# Patient Record
Sex: Male | Born: 1990 | Race: White | Hispanic: No | Marital: Single | Smoking: Current every day smoker
Health system: Southern US, Community
[De-identification: ages and names within clinical notes are randomized; demographics above are authoritative.]

---
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
## 2016-05-13 ENCOUNTER — Inpatient Hospital Stay (HOSPITAL_COMMUNITY)
Admission: AD | Admit: 2016-05-13 | Discharge: 2016-05-17 | DRG: 897 | Disposition: A | Discharge status: Home / Self Care | Payer: Federal, State, Local not specified - Other | Attending: Psychiatry | Admitting: Psychiatry

## 2016-05-13 DIAGNOSIS — F1610 Hallucinogen abuse, uncomplicated: Secondary | ICD-10-CM | POA: Clinically undetermined

## 2016-05-13 DIAGNOSIS — F3289 Other specified depressive episodes: Secondary | ICD-10-CM

## 2016-05-13 DIAGNOSIS — F19950 Other psychoactive substance use, unspecified with psychoactive substance-induced psychotic disorder with delusions: Principal | ICD-10-CM | POA: Diagnosis present

## 2016-05-13 DIAGNOSIS — F1190 Opioid use, unspecified, uncomplicated: Secondary | ICD-10-CM | POA: Diagnosis not present

## 2016-05-13 DIAGNOSIS — F1994 Other psychoactive substance use, unspecified with psychoactive substance-induced mood disorder: Secondary | ICD-10-CM | POA: Diagnosis present

## 2016-05-13 DIAGNOSIS — F332 Major depressive disorder, recurrent severe without psychotic features: Secondary | ICD-10-CM | POA: Diagnosis present

## 2016-05-13 DIAGNOSIS — F1990 Other psychoactive substance use, unspecified, uncomplicated: Secondary | ICD-10-CM

## 2016-05-13 DIAGNOSIS — Z590 Homelessness: Secondary | ICD-10-CM | POA: Diagnosis not present

## 2016-05-13 DIAGNOSIS — F1220 Cannabis dependence, uncomplicated: Secondary | ICD-10-CM | POA: Diagnosis not present

## 2016-05-13 DIAGNOSIS — F1590 Other stimulant use, unspecified, uncomplicated: Secondary | ICD-10-CM | POA: Clinically undetermined

## 2016-05-13 DIAGNOSIS — F19929 Other psychoactive substance use, unspecified with intoxication, unspecified: Secondary | ICD-10-CM

## 2016-05-13 DIAGNOSIS — E876 Hypokalemia: Secondary | ICD-10-CM | POA: Diagnosis present

## 2016-05-13 DIAGNOSIS — Z79899 Other long term (current) drug therapy: Secondary | ICD-10-CM | POA: Diagnosis not present

## 2016-05-13 DIAGNOSIS — F1510 Other stimulant abuse, uncomplicated: Secondary | ICD-10-CM | POA: Diagnosis not present

## 2016-05-13 DIAGNOSIS — F1020 Alcohol dependence, uncomplicated: Secondary | ICD-10-CM | POA: Diagnosis not present

## 2016-05-13 MED ORDER — BENZTROPINE MESYLATE 1 MG PO TABS: 1.0000 mg | ORAL_TABLET | Freq: Four times a day (QID) | ORAL | Status: DC | PRN

## 2016-05-13 MED ORDER — TRAZODONE HCL 50 MG PO TABS
50.0000 mg | ORAL_TABLET | Freq: Every evening | ORAL | Status: DC | PRN
  Filled 2016-05-13: qty 1
  Filled 2016-05-13: qty 7

## 2016-05-13 MED ORDER — LORAZEPAM 2 MG/ML IJ SOLN: 2.0000 mg | Freq: Four times a day (QID) | INTRAMUSCULAR | Status: DC | PRN

## 2016-05-13 MED ORDER — CLONIDINE HCL 0.1 MG PO TABS
0.1000 mg | ORAL_TABLET | Freq: Every day | ORAL | Status: DC
Start: 2016-05-18 — End: 2016-05-13

## 2016-05-13 MED ORDER — LORAZEPAM 1 MG PO TABS: 2.0000 mg | ORAL_TABLET | Freq: Four times a day (QID) | ORAL | Status: DC | PRN

## 2016-05-13 MED ORDER — ACETAMINOPHEN 325 MG PO TABS
650.0000 mg | ORAL_TABLET | Freq: Four times a day (QID) | ORAL | Status: DC | PRN
Start: 2016-05-13 — End: 2016-05-17

## 2016-05-13 MED ORDER — ALUM & MAG HYDROXIDE-SIMETH 200-200-20 MG/5ML PO SUSP: 30.0000 mL | ORAL | Status: DC | PRN

## 2016-05-13 MED ORDER — HYDROXYZINE HCL 25 MG PO TABS
25.0000 mg | ORAL_TABLET | Freq: Four times a day (QID) | ORAL | Status: DC | PRN
  Filled 2016-05-13: qty 1
  Filled 2016-05-13: qty 10

## 2016-05-13 MED ORDER — CLONIDINE HCL 0.1 MG PO TABS: 0.1000 mg | ORAL_TABLET | ORAL | Status: DC

## 2016-05-13 MED ORDER — HALOPERIDOL 5 MG PO TABS: 5.0000 mg | ORAL_TABLET | Freq: Four times a day (QID) | ORAL | Status: DC | PRN

## 2016-05-13 MED ORDER — CLONIDINE HCL 0.1 MG PO TABS
0.1000 mg | ORAL_TABLET | ORAL | Status: DC
  Filled 2016-05-13 (×4): qty 1

## 2016-05-13 MED ORDER — ONDANSETRON 4 MG PO TBDP: 4.0000 mg | ORAL_TABLET | Freq: Four times a day (QID) | ORAL | Status: DC | PRN

## 2016-05-13 MED ORDER — DICYCLOMINE HCL 20 MG PO TABS: 20.0000 mg | ORAL_TABLET | Freq: Four times a day (QID) | ORAL | Status: DC | PRN

## 2016-05-13 MED ORDER — CLONIDINE HCL 0.1 MG PO TABS
0.1000 mg | ORAL_TABLET | Freq: Every day | ORAL | Status: DC
  Filled 2016-05-13 (×2): qty 1

## 2016-05-13 MED ORDER — LOPERAMIDE HCL 2 MG PO CAPS: 2.0000 mg | ORAL_CAPSULE | ORAL | Status: DC | PRN

## 2016-05-13 MED ORDER — MAGNESIUM HYDROXIDE 400 MG/5ML PO SUSP: 30.0000 mL | Freq: Every day | ORAL | Status: DC | PRN

## 2016-05-13 MED ORDER — METHOCARBAMOL 500 MG PO TABS: 500.0000 mg | ORAL_TABLET | Freq: Three times a day (TID) | ORAL | Status: DC | PRN

## 2016-05-13 MED ORDER — CLONIDINE HCL 0.1 MG PO TABS: 0.1000 mg | ORAL_TABLET | Freq: Four times a day (QID) | ORAL | Status: DC

## 2016-05-13 MED ORDER — NAPROXEN 500 MG PO TABS: 500.0000 mg | ORAL_TABLET | Freq: Two times a day (BID) | ORAL | Status: DC | PRN

## 2016-05-13 MED ORDER — CLONIDINE HCL 0.1 MG PO TABS
0.1000 mg | ORAL_TABLET | Freq: Four times a day (QID) | ORAL | Status: AC
  Administered 2016-05-15: 0.1 mg via ORAL
  Filled 2016-05-13 (×11): qty 1

## 2016-05-13 NOTE — Progress Notes (Signed)
Pt presented as angry and uncooperative with assessment. Denied SI/HI/AVH.

## 2016-05-13 NOTE — BH Assessment (Signed)
Out of System Assessment Note  Pt was [facility] ED tonight after being found by LE [place] per pt record. Pt could not expalin why he was there or what he was doing. Pt did state "I was trying to stay away from people," "someone was trying to get me," and "something just ain't right." Pt denied SI, HI and AVH. Per LE, pt stated that he was "seeing shadows" earlier. Pt denied any VH but, appeared as if he was not sure. Pt tested positive for Methamphetamines, MDMA and THC tonight when tested in the ED. Pt sts he is not prescribed any medication, does not have a psychiatrist and does not have a therapist. Pt sts he has never been psychiatrically hospitalized.   Pt sts he is homeless and separated from his wife. Pt sts he graduated high school but, is currently unemployed. Per pt record, pt denies an abuse hx. Pt denies symptoms of depression or anxiety. Pt would not give any information about any legal hx or anger/aggression issues. Pt reported regular (daily if possible) use of methamphetamine, tobacco (nicotine) and alcohol. Pt also reported periodic use of cannabis and fentanyl patches. Pt tested positive for MDMA tonight but denied any use, past or present. Pt sts "maybe someone slipped it to me."   Pt appeared disheveled and unclean, wearing soiled pants and no shirt with what appeared to be dirt on his face and body. Pt appeared confused and irritable. Pt spoke in a clear tone and at a normal pace. Pt moved in a normal manner when he moved. Pt did not appear to be responding to any internal stimuli. Pt stated he was not depressed or anxious. Pt appeared in an angry mood with an angry affect. Pt's memory seemed intact but, his judgement and insight seemed impaired. Pt's thought processes appeared coherent and relevant.  Pt seemed to be having paranoid delusion based on his comments.   Diagnosis:  Major Depressive Disorder. Severe Opioid Abuse   Substance-Induced Psychotic D/O  Past Medical History: No past medical history on file.  No past surgical history on file.  Family History: No family history on file.  Social History:  has no tobacco, alcohol, and drug history on file.  Additional Social History:  Alcohol / Drug Use History of alcohol / drug use?: Yes Longest period of sobriety (when/how long): Pateint denies any history of sobriety Negative Consequences of Use: Financial, Legal, Personal relationships, Work / School Withdrawal Symptoms:  (None Reported) Substance #1 Name of Substance 1: Opiates 1 - Age of First Use: Patient reports that he does not remember  1 - Amount (size/oz): Varies 1 - Frequency: Varies 1 - Duration: Varies 1 - Last Use / Amount: 2 days ago.  Patient reports that he cannot remember what he used.   CIWA:   COWS:    PATIENT STRENGTHS: (choose at least two) Average or above average intelligence Communication skills Physical Health  Allergies: Allergies not on file  Home Medications:  (Not in a hospital admission)  OB/GYN Status:  No LMP for male patient.  General Assessment Data Location of Assessment:  [facility] TTS Assessment: Out of system Is this a Tele or Face-to-Face Assessment?: Tele Assessment Is this an Initial Assessment or a Re-assessment for this encounter?: Initial Assessment Marital status: Married Hector Rivers name: NA Is patient pregnant?: No Pregnancy Status: No Living Arrangements: Other (Comment) (Homeless ) Can pt return to current living arrangement?: Yes Admission Status:  Involuntary Is patient capable of signing voluntary admission?: No Referral Source: Self/Family/Friend Insurance type: Self Pay   Medical Screening Exam [facility] Walk-in ONLY) Medical Exam completed:  (NA)  Crisis Care Plan Living Arrangements: Other (Comment) (Homeless ) Legal Guardian:  (NA) Name of Psychiatrist: None Reported Name of Therapist: None Reported  Education Status Is  patient currently in school?: No Current Grade: NA Highest grade of school patient has completed: NA Name of school: NA Contact person: NA  Risk to self with the past 6 months Suicidal Ideation: No Has patient been a risk to self within the past 6 months prior to admission? : No Suicidal Intent: No Has patient had any suicidal intent within the past 6 months prior to admission? : No Is patient at risk for suicide?: No Suicidal Plan?: No Has patient had any suicidal plan within the past 6 months prior to admission? : No Access to Means: No What has been your use of drugs/alcohol within the last 12 months?: Methamphetamines, MDMA and THC Previous Attempts/Gestures: No How many times?: 0 Other Self Harm Risks: None Reported Triggers for Past Attempts:  (NA) Intentional Self Injurious Behavior: None Family Suicide History: No Recent stressful life event(s): Conflict (Comment), Job Loss, Financial Problems (Seperated from his wife) Persecutory voices/beliefs?: Yes Depression: Yes Depression Symptoms: Despondent, Insomnia, Isolating, Fatigue, Guilt, Loss of interest in usual pleasures, Feeling worthless/self pity, Feeling angry/irritable Substance abuse history and/or treatment for substance abuse?: Yes Suicide prevention information given to non-admitted patients: Not applicable  Risk to Others within the past 6 months Homicidal Ideation: No Does patient have any lifetime risk of violence toward others beyond the six months prior to admission? : No Thoughts of Harm to Others: Yes-Currently Present Comment - Thoughts of Harm to Others: Reports that he has to protect himself Current Homicidal Intent: No Current Homicidal Plan: No Access to Homicidal Means: No Identified Victim: None Reported History of harm to others?: No Assessment of Violence: None Noted Violent Behavior Description: n Does patient have access to weapons?: Yes (Comment) Criminal Charges Pending?: No Does patient  have a court date: No Is patient on probation?: No  Psychosis Hallucinations: None noted Delusions: Grandiose (Believes that someone is after him)  Mental Status Report Appearance/Hygiene: Disheveled Eye Contact: Poor Motor Activity: Freedom of movement Speech: Aggressive, Pressured Level of Consciousness: Alert Mood: Depressed, Angry Affect: Anxious Anxiety Level: Minimal Thought Processes: Flight of Ideas Judgement: Impaired Orientation: Person, Place, Time, Situation Obsessive Compulsive Thoughts/Behaviors: None  Cognitive Functioning Concentration: Decreased Memory: Recent Intact, Remote Intact IQ: Average Insight: Fair Impulse Control: Poor Appetite: Fair Weight Loss: 0 Weight Gain: 0 Sleep: Decreased Total Hours of Sleep: 4 Vegetative Symptoms: Decreased grooming, Staying in bed  ADLScreening [facility] Assessment Services) Patient's cognitive ability adequate to safely complete daily activities?: Yes Patient able to express need for assistance with ADLs?: Yes Independently performs ADLs?: Yes (appropriate for developmental age)  Prior Inpatient Therapy Prior Inpatient Therapy: No Prior Therapy Dates: NA Prior Therapy Facilty/Provider(s): NA Reason for Treatment: NA  Prior Outpatient Therapy Prior Outpatient Therapy: No Prior Therapy Dates: NA Prior Therapy Facilty/Provider(s): NA Reason for Treatment: NA Does patient have an ACCT team?: No Does patient have Intensive In-House Services?  : No Does patient have [facility] services? : No Does patient have P4CC services?: No  ADL Screening (condition at time of admission) Patient's cognitive ability adequate to safely complete daily activities?: Yes Is the patient deaf or have difficulty hearing?: No Does the patient have difficulty seeing, even  when wearing glasses/contacts?: No Does the patient have difficulty concentrating, remembering, or making decisions?: No Patient able to express need for assistance with  ADLs?: Yes Does the patient have difficulty dressing or bathing?: No Independently performs ADLs?: Yes (appropriate for developmental age) Does the patient have difficulty walking or climbing stairs?: No Weakness of Legs: None Weakness of Arms/Hands: None  Home Assistive Devices/Equipment Home Assistive Devices/Equipment: None    Abuse/Neglect Assessment (Assessment to be complete while patient is alone) Physical Abuse: Denies Verbal Abuse: Denies Sexual Abuse: Denies Exploitation of patient/patient's resources: Denies Self-Neglect: Denies Values / Beliefs Cultural Requests During Hospitalization: None Spiritual Requests During Hospitalization: None Consults Spiritual Care Consult Needed: No Social Work Consult Needed: No Merchant navy officer (For Healthcare) Does Patient Have a Medical Advance Directive?: No Would patient like information on creating a medical advance directive?: No - Patient declined    Additional Information 1:1 In Past 12 Months?: No CIRT Risk: No Elopement Risk: No Does patient have medical clearance?: Yes     Disposition: Per Dr. Lucianne Muss patient meets criteria for inpatient hospitalization.  Disposition Initial Assessment Completed for this Encounter: Yes Disposition of Patient: Inpatient treatment program (Per Dr. Lucianne Muss - patient meets criteria for inpatient hospita)  Hector Rivers 05/13/2016 12:22 PM

## 2016-05-13 NOTE — Progress Notes (Signed)
D: Pt denies SI/HI/AVH. Pt isolated to room, pt forwards little information.    A: Pt was offered support and encouragement. . Pt was encourage to attend groups. Q 15 minute checks were done for safety.   R: safety maintained on unit.

## 2016-05-13 NOTE — Progress Notes (Signed)
Did not attend group 

## 2016-05-13 NOTE — Progress Notes (Signed)
Patient refused to allow writer to complete admission.  Patient was extremely distracted, paranoid and unable to focus during the admission. Patient became agitated with writer stating that writer was laughing during the admission.  Patient was noted to be responding to internal stimuli.   Writer communicated with oncoming nurse that admission was not complete and another attempt should be made to complete the admission.  Patient refused to sign all paperwork accompanying admission.

## 2016-05-14 DIAGNOSIS — Z79899 Other long term (current) drug therapy: Secondary | ICD-10-CM

## 2016-05-14 DIAGNOSIS — F332 Major depressive disorder, recurrent severe without psychotic features: Secondary | ICD-10-CM

## 2016-05-14 DIAGNOSIS — F1190 Opioid use, unspecified, uncomplicated: Secondary | ICD-10-CM

## 2016-05-14 DIAGNOSIS — F1990 Other psychoactive substance use, unspecified, uncomplicated: Secondary | ICD-10-CM

## 2016-05-14 NOTE — [facility] Counselor (Signed)
Clinical Social Work Note  Unable to complete Psychosocial Assessment due to patient's disorganization.  Will try again on 05/15/16.  Ambrose MantleMareida Grossman-Orr, LCSW 05/14/2016, 3:50 PM

## 2016-05-14 NOTE — Progress Notes (Signed)
D:  Patient denied SI and HI while talking to nurse, contracts for safety.  Denied A/V hallucinations. A:  Patient has refused to take any medications today, clonidine at 0800, 1200, 1700.  Emotional support and encouragement given patient. R:  Safety maintained with 15 minute checks.

## 2016-05-14 NOTE — H&P (Signed)
Psychiatric Admission Assessment Adult  Patient Identification: Hector Rivers MRN:  [id number] Date of Evaluation:  05/14/2016 Chief Complaint:  MDD SEVERE RECURRENT OPIATE USE DISORDER Principal Diagnosis: MDD (major depressive disorder), recurrent severe, without psychosis (HCC) Diagnosis:   Patient Active Problem List   Diagnosis Date Noted  . Substance use disorder [F19.90]   . MDD (major depressive disorder), recurrent severe, without psychosis (HCC) [F33.2] 05/13/2016  . Opiate use [F11.90] 05/13/2016   History of Present Illness: Per Out of system note-Pt was [facility] ED tonight after being found by LE wandering [place] with a knife per pt record. Pt could not expalin why he was there or what he was doing. Pt did state "I was trying to stay away from people," "someone was trying to get me," and "something just ain't right." Pt denied SI, HI and AVH. Per LE, pt stated that he was "seeing shadows" earlier. Pt denied any VH but, appeared as if he was not sure. Pt tested positive for Methamphetamines, MDMA and THC tonight when tested in the ED. Pt sts he is not prescribed any medication, does not have a psychiatrist and does not have a therapist. Pt sts he has never been psychiatrically hospitalized.  Pt sts he is homeless and separated from his wife. Pt sts he graduated high school but, is currently unemployed. Per pt record, pt denies an abuse hx. Pt denies symptoms of depression or anxiety. Pt would not give any information about any legal hx or anger/aggression issues. Pt reported regular (daily if possible) use of methamphetamine, tobacco (nicotine) and alcohol. Pt also reported periodic use of cannabis and fentanyl patches. Pt tested positive for MDMA tonight but denied any use, past or present. Pt sts "maybe someone slipped it to me."  Pt appeared disheveled and unclean, wearing soiled pants and no shirt with what appeared to be dirt on his face and body. Pt  appeared confused and irritable. Pt spoke in a clear tone and at a normal pace. Pt moved in a normal manner when he moved. Pt did not appear to be responding to any internal stimuli. Pt stated he was not depressed or anxious. Pt appeared in an angry mood with an angry affect. Pt's memory seemed intact but, his judgement and insight seemed impaired. Pt's thought processes appeared coherent and relevant.  Pt seemed to be having paranoid delusion based on his comments.  On evaluation: Hector Rivers is awake, alert and oriented *3 , seen resting in bed.  Denies suicidal or homicidal ideation during this assessment . Reports auditory or visual hallucination on admission.patient does not appear to be responding to internal stimuli.Patient validates information that was provided in the HPI. Support, encouragement and reassurance was provided.   Associated Signs/Symptoms: Depression Symptoms:  depressed mood, difficulty concentrating, hopelessness, (Hypo) Manic Symptoms:  Hallucinations, Impulsivity, Anxiety Symptoms:  Excessive Worry, Psychotic Symptoms:  Hallucinations: Auditory PTSD Symptoms: Avoidance:  Decreased Interest/Participation Total Time spent with patient: 30 minutes  Past Psychiatric History:   Is the patient at risk to self? No.  Has the patient been a risk to self in the past 6 months? No.  Has the patient been a risk to self within the distant past? Yes.    Is the patient a risk to others? No.  Has the patient been a risk to others in the past 6 months? No.  Has the patient been a risk to others within the distant past? No.   Prior Inpatient  Therapy: Prior Inpatient Therapy: No Prior Therapy Dates: NA Prior Therapy Facilty/Provider(s): NA Reason for Treatment: NA Prior Outpatient Therapy: Prior Outpatient Therapy: No Prior Therapy Dates: NA Prior Therapy Facilty/Provider(s): NA Reason for Treatment: NA Does patient have an ACCT team?: No Does patient have Intensive  In-House Services?  : No Does patient have [facility] services? : No Does patient have P4CC services?: No  Alcohol Screening:   Substance Abuse History in the last 12 months:  Yes.   Consequences of Substance Abuse: Withdrawal Symptoms:   Headaches Nausea Previous Psychotropic Medications: YES Psychological Evaluations: YES Past Medical History: No past medical history on file. No past surgical history on file. Family History: No family history on file. Family Psychiatric  History:  Tobacco Screening:   Social History:  History  Alcohol use Not on file     History  Drug use: Unknown    Additional Social History: Marital status: Married    History of alcohol / drug use?: Yes Longest period of sobriety (when/how long): Pateint denies any history of sobriety Negative Consequences of Use: Surveyor, quantity, Armed forces operational officer, Personal relationships, Work / Programmer, multimedia Withdrawal Symptoms:  (None Reported) Name of Substance 1: Opiates 1 - Age of First Use: Patient reports that he does not remember  1 - Amount (size/oz): Varies 1 - Frequency: Varies 1 - Duration: Varies 1 - Last Use / Amount: 2 days ago.  Patient reports that he cannot remember what he used.                   Allergies:  No Known Allergies Lab Results: No results found for this or any previous visit (from the past 48 hour(s)).  Blood Alcohol level:  No results found for: [facility]  Metabolic Disorder Labs:  No results found for: HGBA1C, MPG No results found for: PROLACTIN No results found for: CHOL, TRIG, HDL, CHOLHDL, VLDL, LDLCALC  Current Medications: Current Facility-Administered Medications  Medication Dose Route Frequency Provider Last Rate Last Dose  . acetaminophen (TYLENOL) tablet 650 mg  650 mg Oral Q6H PRN Laveda Abbe, NP      . alum & mag hydroxide-simeth (MAALOX/MYLANTA) 200-200-20 MG/5ML suspension 30 mL  30 mL Oral Q4H PRN Laveda Abbe, NP      . haloperidol (HALDOL) tablet 5 mg  5 mg Oral Q6H PRN  Laveda Abbe, NP       And  . benztropine (COGENTIN) tablet 1 mg  1 mg Oral Q6H PRN Laveda Abbe, NP      . cloNIDine (CATAPRES) tablet 0.1 mg  0.1 mg Oral QID Laveda Abbe, NP       Followed by  . [START ON 05/16/2016] cloNIDine (CATAPRES) tablet 0.1 mg  0.1 mg Oral BH-qamhs Laveda Abbe, NP       Followed by  . [START ON 05/18/2016] cloNIDine (CATAPRES) tablet 0.1 mg  0.1 mg Oral QAC breakfast Laveda Abbe, NP      . dicyclomine (BENTYL) tablet 20 mg  20 mg Oral Q6H PRN Laveda Abbe, NP      . hydrOXYzine (ATARAX/VISTARIL) tablet 25 mg  25 mg Oral Q6H PRN Laveda Abbe, NP      . loperamide (IMODIUM) capsule 2-4 mg  2-4 mg Oral PRN Laveda Abbe, NP      . LORazepam (ATIVAN) tablet 2 mg  2 mg Oral Q6H PRN Laveda Abbe, NP       Or  . LORazepam (ATIVAN) injection 2 mg  2 mg Intramuscular Q6H PRN Laveda Abbe, NP      . magnesium hydroxide (MILK OF MAGNESIA) suspension 30 mL  30 mL Oral Daily PRN Laveda Abbe, NP      . methocarbamol (ROBAXIN) tablet 500 mg  500 mg Oral Q8H PRN Laveda Abbe, NP      . naproxen (NAPROSYN) tablet 500 mg  500 mg Oral BID PRN Laveda Abbe, NP      . ondansetron (ZOFRAN-ODT) disintegrating tablet 4 mg  4 mg Oral Q6H PRN Laveda Abbe, NP      . traZODone (DESYREL) tablet 50 mg  50 mg Oral QHS PRN Laveda Abbe, NP       PTA Medications: No prescriptions prior to admission.    Musculoskeletal: Strength & Muscle Tone: within normal limits Gait & Station: normal Patient leans: N/A  Psychiatric Specialty Exam: Physical Exam  ROS  Blood pressure 110/76, pulse (!) 110, temperature 97.7 F (36.5 C), temperature source Oral, resp. rate 18.There is no height or weight on file to calculate BMI.  General Appearance: Casual and Disheveled  Eye Contact:  Good  Speech:  Clear and Coherent  Volume:  Normal  Mood:  Anxious and Depressed  Affect:   Congruent  Thought Process:  Linear  Orientation:  Full (Time, Place, and Person)  Thought Content:  Hallucinations: Auditory Visual  Suicidal Thoughts:  No  Homicidal Thoughts:  No  Memory:  Immediate;   Poor Recent;   Fair Remote;   Fair  Judgement:  Fair  Insight:  Lacking  Psychomotor Activity:  Restlessness  Concentration:  Concentration: Fair  Recall:  [employer] of Knowledge:  Fair  Language:  Good  Akathisia:  No  Handed:  Right  AIMS (if indicated):     Assets:  Communication Skills Physical Health Resilience  ADL's:  Intact  Cognition:  WNL  Sleep:  Number of Hours: 6.75     I agree with current treatment plan on 05/14/2016, Patient seen face-to-face for psychiatric evaluation follow-up, chart reviewed and case discussed with the MD Akhatar. Reviewed the information documented and agree with the treatment plan.  Treatment Plan Summary: Daily contact with patient to assess and evaluate symptoms and progress in treatment and Medication management    Observation Level/Precautions:  15 minute checks  Laboratory:  CBC Chemistry Profile UA  Psychotherapy:  Individual and group session  Medications:  See above  Consultations:  psychiatry  Discharge Concerns:  Safety, stabilization, and risk of access to medication and medication stabilization   Estimated LOS: 5-7days  Other:     Physician Treatment Plan for Primary Diagnosis: MDD (major depressive disorder), recurrent severe, without psychosis (HCC) Long Term Goal(s): Improvement in symptoms so as ready for discharge  Short Term Goals: Ability to identify changes in lifestyle to reduce recurrence of condition will improve, Ability to verbalize feelings will improve and Ability to identify triggers associated with substance abuse/mental health issues will improve  Physician Treatment Plan for Secondary Diagnosis: Principal Problem:   MDD (major depressive disorder), recurrent severe, without psychosis  (HCC) Active Problems:   Opiate use   Substance use disorder  Long Term Goal(s): Improvement in symptoms so as ready for discharge  Short Term Goals: Ability to verbalize feelings will improve, Ability to disclose and discuss suicidal ideas and Ability to maintain clinical measurements within normal limits will improve  I certify that inpatient services furnished can reasonably be expected to improve the patient's condition.  Oneta Rackanika N Lewis, NP 12/9/20173:56 PM  I have examined the patient and agreed with the findings of H&P and treatment plan. I have also done suicide assessment on this patient.

## 2016-05-14 NOTE — [facility] Group Notes (Signed)
[facility] Group Notes: (Clinical Social Work)   05/14/2016      Type of Therapy:  Group Therapy   Participation Level:  Did Not Attend despite MHT prompting   Ambrose MantleMareida Grossman-Orr, LCSW 05/14/2016, 1:07 PM

## 2016-05-14 NOTE — [facility] Suicide Risk Assessment (Signed)
[facility]   Nursing information obtained from:    Demographic factors:    Current Mental Status:    Loss Factors:    Historical Factors:    Risk Reduction Factors:     Total Time spent with patient: 1 hour Principal Problem: MDD (major depressive disorder), recurrent severe, without psychosis (HCC) Diagnosis:   Patient Active Problem List   Diagnosis Date Noted  . MDD (major depressive disorder), recurrent severe, without psychosis (HCC) [F33.2] 05/13/2016  . Opiate use [F11.90] 05/13/2016   Subjective Data: alert, irritable. Depressed and paranoid  Continued Clinical Symptoms:    The "Alcohol Use Disorders Identification Test", Guidelines for Use in Primary Care, Second Edition.  World Science writerHealth Organization [facility](WHO). Score between 0-7:  no or low risk or alcohol related problems. Score between 8-15:  moderate risk of alcohol related problems. Score between 16-19:  high risk of alcohol related problems. Score 20 or above:  warrants further diagnostic evaluation for alcohol dependence and treatment.   CLINICAL FACTORS:   Alcohol/Substance Abuse/Dependencies More than one psychiatric diagnosis Currently Psychotic Unstable or Poor Therapeutic Relationship   Musculoskeletal: Strength & Muscle Tone: within normal limits Gait & Station: normal Patient leans: no lean  Psychiatric Specialty Exam: Physical Exam  Review of Systems  Cardiovascular: Negative for chest pain.  Neurological: Negative for tremors.  Psychiatric/Behavioral: Positive for depression, hallucinations and substance abuse. The patient is nervous/anxious.     There were no vitals taken for this visit.There is no height or weight on file to calculate BMI.  General Appearance: Disheveled  Eye Contact:  Minimal  Speech:  Slow  Volume:  Decreased  Mood:  Depressed and Dysphoric  Affect:  Constricted and Depressed  Thought Process:  Disorganized  Orientation:  Full (Time, Place, and  Person)  Thought Content:  Delusions and Rumination  Suicidal Thoughts:  No  Homicidal Thoughts:  No  Memory:  Immediate;   Fair Recent;   Poor  Judgement:  Poor  Insight:  Present  Psychomotor Activity:  Normal  Concentration:  Concentration: Fair and Attention Span: Fair  Recall:  Poor  Fund of Knowledge:  Poor  Language:  Fair  Akathisia:  Negative  Handed:  Right  AIMS (if indicated):     Assets:  Desire for Improvement  ADL's:  Intact  Cognition:  WNL  Sleep:  Number of Hours: 6.75      COGNITIVE FEATURES THAT CONTRIBUTE TO RISK:  Closed-mindedness    SUICIDE RISK:   Moderate:  Frequent suicidal ideation with limited intensity, and duration, some specificity in terms of plans, no associated intent, good self-control, limited dysphoria/symptomatology, some risk factors present, and identifiable protective factors, including available and accessible social support.   PLAN OF CARE: Admit for stabilization. Medication management and safety. Drug use and insight.   I certify that inpatient services furnished can reasonably be expected to improve the patient's condition.  Thresa RossAKHTAR, Markiah Janeway, MD 05/14/2016, 12:35 PM

## 2016-05-14 NOTE — Progress Notes (Signed)
Nursing Progress Note 7p-7a  D) Patient presents with flat affect. Patient blank and presents with thought blocking at times. Patient refused prescribed Clonidine for 2200. Patient states "I don't need anything". Patient denies SI/HI/AVH or pain. Patient contracts for safety at this time.   A) Patient educated about importance of taking medications and complying with the goals of treatment. When encouraged, patient states "I don't need anything". Emotional support given. Patient on q15 min safety checks. Opportunities for questions or concerns presented to patient. Patient encouraged to continue to identify why he has come to the hospital.  R) Patient remains safe on the unit at this time. Patient is resting in bed without complaints. Will continue to monitor.

## 2016-05-14 NOTE — Progress Notes (Signed)
Patient refused clonidine this morning.  Stated he does not need this medication.  Patient denied SI and HI, contracts for safety.  Denied A/V hallucinations.  Respirations even and unlabored.  No signs/symptoms of pain/distress noted on patient's face/body movements.  Safety maintained with 15 minute checks.

## 2016-05-14 NOTE — Progress Notes (Signed)
[facility] Group Notes:  (Nursing/MHT/Case Management/Adjunct)  Date:  05/14/2016  Time:  9:21 PM  Type of Therapy:  Psychoeducational Skills  Participation Level:  Minimal  Participation Quality:  Inattentive  Affect:  Flat  Cognitive:  Lacking  Insight:  Limited  Engagement in Group:  Resistant  Modes of Intervention:  Education  Summary of Progress/Problems: The patient verbalized in group that his day was "all right". He states that he went to groups but chose not to participate. In terms of the theme of the day, his support system will consist of his son.   Hazle CocaGOODMAN, Lycan Davee S 05/14/2016, 9:21 PM

## 2016-05-14 NOTE — Plan of Care (Signed)
Problem: Education: Goal: Ability to state activities that reduce stress will improve Outcome: Progressing Nurse discussed depression/anxiety/coping skills with patient.    

## 2016-05-14 NOTE — Progress Notes (Signed)
Patient remains paranoid and uncooperative. Patient gives short answers with Clinical research associatewriter and at times presents with though-blocking. Unable to complete assessments for admission. Will report to next shift to reassess in AM.

## 2016-05-14 NOTE — [facility] Group Notes (Signed)
The focus of this group is to educate the patient on the purpose and policies of crisis stabilization and provide a format to answer questions about their admission.  The group details unit policies and expectations of patients while admitted.  Patient did not attend 0900 nurse education orientation group this morning.  Patient stayed in bed.   

## 2016-05-14 NOTE — Plan of Care (Signed)
Problem: Health Behavior/Discharge Planning: Goal: Compliance with treatment plan for underlying cause of condition will improve Outcome: Not Progressing Patient not taking medications as prescribed. Patient has no insight to why he is in the hospital. Patient is minimal during interactions with staff.  Problem: Safety: Goal: Periods of time without injury will increase Outcome: Progressing Patient remains safe on the unit at this time. Patient on q15 min safety checks. VSS.

## 2016-05-15 NOTE — Progress Notes (Signed)
[facility]  05/15/2016 9:52 AM Runell GessDillon C Linthicum  MRN:  [id number] Subjective:  Patient reports " I am doing okay (smiling) Patient reports I got a hold to some bad meth."  Objective:Haroun C Katrinka BlazingSmith is awake, alert and oriented *3. Seen resting in bedroom. Denies suicidal or homicidal ideation. Denies auditory or visual hallucination and does not appear to be responding to internal stimuli. Per staffing notes pt is isolative and guarded.  Patient reports she is medication compliant without mediation side effects.  Patient denies depression or depressive symptoms. Reports good appetite and reports  resting well. Support, encouragement and reassurance was provided.    Principal Problem: MDD (major depressive disorder), recurrent severe, without psychosis (HCC) Diagnosis:   Patient Active Problem List   Diagnosis Date Noted  . Substance use disorder [F19.90]   . MDD (major depressive disorder), recurrent severe, without psychosis (HCC) [F33.2] 05/13/2016  . Opiate use [F11.90] 05/13/2016   Total Time spent with patient: 30 minutes  Past Psychiatric History:   Past Medical History: No past medical history on file. No past surgical history on file. Family History: No family history on file. Family Psychiatric  History:  Social History:  History  Alcohol use Not on file     History  Drug use: Unknown    Social History   Social History  . Marital status: Single    Spouse name: N/A  . Number of children: N/A  . Years of education: N/A   Social History Main Topics  . Smoking status: Not on file  . Smokeless tobacco: Not on file  . Alcohol use Not on file  . Drug use: Unknown  . Sexual activity: Not on file   Other Topics Concern  . Not on file   Social History Narrative  . No narrative on file   Additional Social History:    History of alcohol / drug use?: Yes Longest period of sobriety (when/how long): Pateint denies any history of sobriety Negative  Consequences of Use: Financial, Armed forces operational officerLegal, Personal relationships, Work / School Withdrawal Symptoms:  (None Reported) Name of Substance 1: Opiates 1 - Age of First Use: Patient reports that he does not remember  1 - Amount (size/oz): Varies 1 - Frequency: Varies 1 - Duration: Varies 1 - Last Use / Amount: 2 days ago.  Patient reports that he cannot remember what he used.                   Sleep: Fair  Appetite:  Fair  Current Medications: Current Facility-Administered Medications  Medication Dose Route Frequency Provider Last Rate Last Dose  . acetaminophen (TYLENOL) tablet 650 mg  650 mg Oral Q6H PRN Laveda AbbeLaurie Britton Parks, NP      . alum & mag hydroxide-simeth (MAALOX/MYLANTA) 200-200-20 MG/5ML suspension 30 mL  30 mL Oral Q4H PRN Laveda AbbeLaurie Britton Parks, NP      . haloperidol (HALDOL) tablet 5 mg  5 mg Oral Q6H PRN Laveda AbbeLaurie Britton Parks, NP       And  . benztropine (COGENTIN) tablet 1 mg  1 mg Oral Q6H PRN Laveda AbbeLaurie Britton Parks, NP      . cloNIDine (CATAPRES) tablet 0.1 mg  0.1 mg Oral QID Laveda AbbeLaurie Britton Parks, NP       Followed by  . [START ON 05/16/2016] cloNIDine (CATAPRES) tablet 0.1 mg  0.1 mg Oral BH-qamhs Laveda AbbeLaurie Britton Parks, NP       Followed by  . [START ON 05/18/2016] cloNIDine (CATAPRES) tablet  0.1 mg  0.1 mg Oral QAC breakfast Laveda Abbe, NP      . dicyclomine (BENTYL) tablet 20 mg  20 mg Oral Q6H PRN Laveda Abbe, NP      . hydrOXYzine (ATARAX/VISTARIL) tablet 25 mg  25 mg Oral Q6H PRN Laveda Abbe, NP      . loperamide (IMODIUM) capsule 2-4 mg  2-4 mg Oral PRN Laveda Abbe, NP      . LORazepam (ATIVAN) tablet 2 mg  2 mg Oral Q6H PRN Laveda Abbe, NP       Or  . LORazepam (ATIVAN) injection 2 mg  2 mg Intramuscular Q6H PRN Laveda Abbe, NP      . magnesium hydroxide (MILK OF MAGNESIA) suspension 30 mL  30 mL Oral Daily PRN Laveda Abbe, NP      . methocarbamol (ROBAXIN) tablet 500 mg  500 mg Oral Q8H PRN Laveda Abbe, NP      . naproxen (NAPROSYN) tablet 500 mg  500 mg Oral BID PRN Laveda Abbe, NP      . ondansetron (ZOFRAN-ODT) disintegrating tablet 4 mg  4 mg Oral Q6H PRN Laveda Abbe, NP      . traZODone (DESYREL) tablet 50 mg  50 mg Oral QHS PRN Laveda Abbe, NP        Lab Results: No results found for this or any previous visit (from the past 48 hour(s)).  Blood Alcohol level:  No results found for: [facility]  Metabolic Disorder Labs: No results found for: HGBA1C, MPG No results found for: PROLACTIN No results found for: CHOL, TRIG, HDL, CHOLHDL, VLDL, LDLCALC  Physical Findings: AIMS: Facial and Oral Movements Muscles of Facial Expression: None, normal Lips and Perioral Area: None, normal Jaw: None, normal Tongue: None, normal,Extremity Movements Upper (arms, wrists, hands, fingers): None, normal Lower (legs, knees, ankles, toes): None, normal, Trunk Movements Neck, shoulders, hips: None, normal, Overall Severity Severity of abnormal movements (highest score from questions above): None, normal Incapacitation due to abnormal movements: None, normal Patient's awareness of abnormal movements (rate only patient's report): No Awareness, Dental Status Current problems with teeth and/or dentures?: No Does patient usually wear dentures?: No  CIWA:  CIWA-Ar Total: 1 COWS:  COWS Total Score: 2  Musculoskeletal: Strength & Muscle Tone: within normal limits Gait & Station: normal Patient leans: N/A  Psychiatric Specialty Exam: Physical Exam  Nursing note and vitals reviewed. Constitutional: He is oriented to person, place, and time.  Cardiovascular: Normal rate.   Musculoskeletal: Normal range of motion.  Neurological: He is alert and oriented to person, place, and time.  Psychiatric: He has a normal mood and affect. His behavior is normal.    Review of Systems  Psychiatric/Behavioral: Positive for depression and hallucinations. The patient is  nervous/anxious.     Blood pressure 126/73, pulse 89, temperature 97.8 F (36.6 C), temperature source Oral, resp. rate 16.There is no height or weight on file to calculate BMI.  General Appearance: Casual  Eye Contact:  Fair  Speech:  Clear and Coherent  Volume:  Normal  Mood:  Depressed  Affect:  Depressed and Flat  Thought Process:  Coherent  Orientation:  Full (Time, Place, and Person)  Thought Content:  Hallucinations: None  Suicidal Thoughts:  No  Homicidal Thoughts:  No  Memory:  Immediate;   Fair Recent;   Fair Remote;   Fair  Judgement:  Fair  Insight:  Lacking  Psychomotor Activity:  Restlessness  Concentration:  Concentration: Fair  Recall:  [employer]Fair  Fund of Knowledge:  Fair  Language:  Fair  Akathisia:  No  Handed:  Right  AIMS (if indicated):     Assets:  Communication Skills Desire for Improvement Resilience Social Support  ADL's:  Intact  Cognition:  WNL  Sleep:  Number of Hours: 6.75     I agree with current treatment plan on 05/15/2016, Patient seen face-to-face for psychiatric evaluation follow-up, chart reviewed. Reviewed the information documented and agree with the treatment plan.  Treatment Plan Summary: Daily contact with patient to assess and evaluate symptoms and progress in treatment and Medication management  Continue with Trazodone 50 mg for insomnia Started on clonidine Protocol Will continue to monitor vitals ,medication compliance and treatment side effects while patient is here.  CSW will start working on disposition.  Patient to participate in therapeutic milieu  Oneta Rackanika N Lewis, NP 05/15/2016, 9:52 AM  Agree to the notes and plan

## 2016-05-15 NOTE — BHH Counselor (Signed)
Adult Comprehensive Assessment  Patient ID: Hector Rivers C Kulesza, male   DOB: May 08, 1991, 25 y.o.   MRN: [id number]  Information Source: Information source: Patient  Current Stressors:  Housing / Lack of housing: homeless for 2 months Substance abuse: meth abuse  Living/Environment/Situation:  Living Arrangements: Other (Comment) Living conditions (as described by patient or guardian): Pt states that he's been homeless for the last 2 months in [name] area.   How long has patient lived in current situation?: 2 months What is atmosphere in current home: Temporary  Family History:  Marital status: Separated Separated, when?: 6 months ago What types of issues is patient dealing with in the relationship?: pt's drug use caused the separation Does patient have children?: Yes How many children?: 1 How is patient's relationship with their children?: 25 year old son - pt reports his mom cares for him, states that he doesn't see him often due to his drug use  Childhood History:  By whom was/is the patient raised?: Grandparents Additional childhood history information: Pt reports being raised by grandmother and grandfather.  Pt reports having a really good childhood.  Description of patient's relationship with caregiver when they were a child: Pt reports getting along well with grandparents growing up.  Patient's description of current relationship with people who raised him/her: Pt reports not being close to grandparents today due to his drug use Does patient have siblings?: Yes Number of Siblings: 5 Description of patient's current relationship with siblings: 3 sisters, 2 brothers - reports not being close to them currently due to his drug use Did patient suffer any verbal/emotional/physical/sexual abuse as a child?: No Did patient suffer from severe childhood neglect?: No Has patient ever been sexually abused/assaulted/raped as an adolescent or adult?: No Was the patient ever a victim of  a crime or a disaster?: No Witnessed domestic violence?: No Has patient been effected by domestic violence as an adult?: No  Education:  Highest grade of school patient has completed: graduated high school Currently a student?: No Name of school: NA Contact person: NA Learning disability?: No  Employment/Work Situation:   Employment situation: Unemployed Patient's job has been impacted by current illness: No What is the longest time patient has a held a job?: unsure 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 Has patient ever been in the Eli Lilly and Companymilitary?: No Has patient ever served in combat?: No Did You Receive Any Psychiatric Treatment/Services While in Equities traderthe Military?: No Are There Guns or Other Weapons in Your Home?: No  Financial Resources:   Financial resources: No income Does patient have a Lawyerrepresentative payee or guardian?: No  Alcohol/Substance Abuse:   What has been your use of drugs/alcohol within the last 12 months?: Pt only admits to meth use at this time - use amount varies, reports using whenever he can If attempted suicide, did drugs/alcohol play a role in this?: No Alcohol/Substance Abuse Treatment Hx: Denies past history Has alcohol/substance abuse ever caused legal problems?: No  Social Support System:   Forensic psychologistatient's Community Support System: Poor Describe Community Support System: Pt reports having a limited support system due his drug use Type of faith/religion: Pt denies How does patient's faith help to cope with current illness?: N/A  Leisure/Recreation:   Leisure and Hobbies: Photographerdrag racing, writing  Strengths/Needs:   What things does the patient do well?: sports In what areas does patient struggle / problems for patient: drug use  Discharge Plan:   Does patient have access to transportation?: Yes Will patient be  returning to same living situation after discharge?: Yes Currently receiving community mental health services: No If no, would  patient like referral for services when discharged?: No Does patient have financial barriers related to discharge medications?: No  Summary/Recommendations:   Summary and Recommendations (to be completed by the evaluator): Patient is a 25 year old male, with a diagnosis of Major Depressive Disorder, severe, Opioid Use Disorder, Substance Induced Psychotic Disorder, on admission.  Patient presented to the hospital with hallucinations.  Patient reports primary trigger for admission was hallucinating after drug use. Patient will benefit from crisis stabilization, medication evaluation, group therapy and psycho education in addition to case management for discharge planning. At discharge, it is recommended that patient remain compliant with established discharge plan and continued treatment.    Pt presents with calm mood and affect.  Pt states that he came to the hospital after "seeing things" but pt denies this happened.  Pt asking when he can discharge.  Pt is homeless in [name] area.  Pt is not interested in follow up after discharge.  Pt was pleasant throughout the assessment but then refused to sign consents and Discharge Process and Patient Expectations information sheet, stating he didn't need to sign anything.  Pt is a smoker but is not interested in [place] Quitline at discharge.   Leona SingletonHarvey, Woodruff Skirvin Nicole. 05/15/2016

## 2016-05-15 NOTE — Progress Notes (Signed)
D: Pt denies SI/HI/AVH. Pt is pleasant and cooperative. Pt presents paranoid/ suspicious. Pt forwards little, but did state he wanted to get himself better so he could spend time with 89 child.   A: Pt was offered support and encouragement. Pt was given scheduled medications. Pt was encourage to attend groups. Q 15 minute checks were done for safety.   R:Pt attends groups and interacts well with peers and staff. Pt is taking medication. Pt has no complaints at this time .Pt receptive to treatment and safety maintained on unit.

## 2016-05-15 NOTE — [facility] Group Notes (Signed)
[facility] Group Notes:  (Nursing/MHT/Case Management/Adjunct)  Date:  05/15/2016  Time:  10:53 AM  Type of Therapy:  Nurse Education  Participation Level:  Did Not Attend   Almira Barenny G Marshaun Lortie 05/15/2016, 10:53 AM

## 2016-05-15 NOTE — Progress Notes (Signed)
Data. Patient denies SI/HI/AVH. Patient spent most of the shift in his room. He did go to [employer]the caf[employer]ia for meals. Affect is bright on approach, but closed most of the time. Patients responses to questions are very short and mostly one word.  Action. Emotional support and encouragement offered. Education provided on medication, indications and side effect. Q 15 minute checks done for safety. Response. Safety on the unit maintained through 15 minute checks.  Medications taken as prescribed. Remained calm and appropriate through out shift.

## 2016-05-15 NOTE — Plan of Care (Signed)
Problem: Education: Goal: Knowledge of the prescribed therapeutic regimen will improve Outcome: Progressing Patient only took one of his medications this shift.

## 2016-05-15 NOTE — [facility] Group Notes (Signed)
[facility] Group Notes:  (Clinical Social Work)  05/15/2016  11:00AM-12:00PM  Summary of Progress/Problems:  The main focus of today's process group was to listen to a variety of genres of music and to identify that different types of music provoke different responses.  The patient then was able to identify personally what was soothing for them, as well as energizing, as well as how patient can personally use this knowledge in sleep habits, with depression, and with other symptoms.  The patient expressed when he came into group at about the halfway point the overall feeling of "good" and stated at the end of group that he felt "even better."  While he initially seemed to be resistant, he started to smile and nod up and down to the music fairly quickly.  Type of Therapy:  Music Therapy   Participation Level:  Active  Participation Quality:  Attentive and Sharing  Affect:  Blunted and Irritable  Cognitive:  Oriented  Insight:  Engaged  Engagement in Therapy:  Engaged  Modes of Intervention:   Activity, Exploration  Ambrose MantleMareida Grossman-Orr, LCSW 05/15/2016

## 2016-05-15 NOTE — Progress Notes (Signed)
The patient attended group but refused to share.  

## 2016-05-16 ENCOUNTER — Encounter (HOSPITAL_COMMUNITY): Payer: Self-pay | Admitting: Psychiatry

## 2016-05-16 DIAGNOSIS — F1220 Cannabis dependence, uncomplicated: Secondary | ICD-10-CM | POA: Clinically undetermined

## 2016-05-16 DIAGNOSIS — F1590 Other stimulant use, unspecified, uncomplicated: Secondary | ICD-10-CM | POA: Clinically undetermined

## 2016-05-16 DIAGNOSIS — F19929 Other psychoactive substance use, unspecified with intoxication, unspecified: Secondary | ICD-10-CM

## 2016-05-16 DIAGNOSIS — F1994 Other psychoactive substance use, unspecified with psychoactive substance-induced mood disorder: Secondary | ICD-10-CM | POA: Clinically undetermined

## 2016-05-16 DIAGNOSIS — F19950 Other psychoactive substance use, unspecified with psychoactive substance-induced psychotic disorder with delusions: Secondary | ICD-10-CM | POA: Clinically undetermined

## 2016-05-16 DIAGNOSIS — F1020 Alcohol dependence, uncomplicated: Secondary | ICD-10-CM | POA: Clinically undetermined

## 2016-05-16 DIAGNOSIS — F3289 Other specified depressive episodes: Secondary | ICD-10-CM

## 2016-05-16 DIAGNOSIS — E876 Hypokalemia: Secondary | ICD-10-CM | POA: Diagnosis present

## 2016-05-16 DIAGNOSIS — F1610 Hallucinogen abuse, uncomplicated: Secondary | ICD-10-CM | POA: Clinically undetermined

## 2016-05-16 DIAGNOSIS — F1510 Other stimulant abuse, uncomplicated: Secondary | ICD-10-CM

## 2016-05-16 NOTE — Progress Notes (Signed)
  [facility] :  Will you be returning to the same living situation after discharge:  Yes,  [name] At discharge, do you have transportation home?: Yes,  grandmother Do you have the ability to pay for your medications: Yes,  states he will not take meds  Release of information consent forms completed and in the chart;  Patient's signature needed at discharge.  Patient to Follow up at: Follow-up Information    No one Follow up.   Why:  Pt declined follow up          Next level of care provider [facility]:no  Safety Planning and Suicide Prevention discussed: Yes,  yes     Has patient been referred to the Quitline?: Patient refused referral  Patient has been referred for addiction treatment: Pt. refused referral  Hector Rivers 05/16/2016, 3:19 PM

## 2016-05-16 NOTE — Tx Team (Signed)
Interdisciplinary Treatment and Diagnostic Plan Update  05/16/2016 Time of Session: 8:40 AM  Hector Rivers MRN: [id number]  Principal Diagnosis: MDD (major depressive disorder), recurrent severe, without psychosis ([place])  Secondary Diagnoses: Principal Problem:   MDD (major depressive disorder), recurrent severe, without psychosis ([place]) Active Problems:   Opiate use   Substance use disorder   Current Medications:  Current Facility-Administered Medications  Medication Dose Route Frequency Provider Last Rate Last Dose  . acetaminophen (TYLENOL) tablet 650 mg  650 mg Oral Q6H PRN Ethelene Hal, NP      . alum & mag hydroxide-simeth (MAALOX/MYLANTA) 200-200-20 MG/5ML suspension 30 mL  30 mL Oral Q4H PRN Ethelene Hal, NP      . haloperidol (HALDOL) tablet 5 mg  5 mg Oral Q6H PRN Ethelene Hal, NP       And  . benztropine (COGENTIN) tablet 1 mg  1 mg Oral Q6H PRN Ethelene Hal, NP      . cloNIDine (CATAPRES) tablet 0.1 mg  0.1 mg Oral BH-qamhs Ethelene Hal, NP       Followed by  . [START ON 05/18/2016] cloNIDine (CATAPRES) tablet 0.1 mg  0.1 mg Oral QAC breakfast Ethelene Hal, NP      . dicyclomine (BENTYL) tablet 20 mg  20 mg Oral Q6H PRN Ethelene Hal, NP      . hydrOXYzine (ATARAX/VISTARIL) tablet 25 mg  25 mg Oral Q6H PRN Ethelene Hal, NP      . loperamide (IMODIUM) capsule 2-4 mg  2-4 mg Oral PRN Ethelene Hal, NP      . LORazepam (ATIVAN) tablet 2 mg  2 mg Oral Q6H PRN Ethelene Hal, NP       Or  . LORazepam (ATIVAN) injection 2 mg  2 mg Intramuscular Q6H PRN Ethelene Hal, NP      . magnesium hydroxide (MILK OF MAGNESIA) suspension 30 mL  30 mL Oral Daily PRN Ethelene Hal, NP      . methocarbamol (ROBAXIN) tablet 500 mg  500 mg Oral Q8H PRN Ethelene Hal, NP      . naproxen (NAPROSYN) tablet 500 mg  500 mg Oral BID PRN Ethelene Hal, NP      . ondansetron (ZOFRAN-ODT)  disintegrating tablet 4 mg  4 mg Oral Q6H PRN Ethelene Hal, NP      . traZODone (DESYREL) tablet 50 mg  50 mg Oral QHS PRN Ethelene Hal, NP        PTA Medications: No prescriptions prior to admission.    Treatment Modalities: Medication Management, Group therapy, Case management,  1 to 1 session with clinician, Psychoeducation, Recreational therapy.   Physician Treatment Plan for Primary Diagnosis: MDD (major depressive disorder), recurrent severe, without psychosis ([place]) Long Term Goal(s): Improvement in symptoms so as ready for discharge  Short Term Goals: Ability to identify changes in lifestyle to reduce recurrence of condition will improve Ability to verbalize feelings will improve Ability to identify triggers associated with substance abuse/mental health issues will improve Ability to verbalize feelings will improve Ability to disclose and discuss suicidal ideas Ability to maintain clinical measurements within normal limits will improve  Medication Management: Evaluate patient's response, side effects, and tolerance of medication regimen.  Therapeutic Interventions: 1 to 1 sessions, Unit Group sessions and Medication administration.  Evaluation of Outcomes: Adequate for Discharge  Physician Treatment Plan for Secondary Diagnosis: Principal Problem:   MDD (major depressive disorder), recurrent severe,  without psychosis ([place]) Active Problems:   Opiate use   Substance use disorder   Long Term Goal(s): Improvement in symptoms so as ready for discharge  Short Term Goals: Ability to identify changes in lifestyle to reduce recurrence of condition will improve Ability to verbalize feelings will improve Ability to identify triggers associated with substance abuse/mental health issues will improve Ability to verbalize feelings will improve Ability to disclose and discuss suicidal ideas Ability to maintain clinical measurements within normal limits will  improve  Medication Management: Evaluate patient's response, side effects, and tolerance of medication regimen.  Therapeutic Interventions: 1 to 1 sessions, Unit Group sessions and Medication administration.  Evaluation of Outcomes: Adequate for Discharge   RN Treatment Plan for Primary Diagnosis: MDD (major depressive disorder), recurrent severe, without psychosis ([place]) Long Term Goal(s): Knowledge of disease and therapeutic regimen to maintain health will improve  Short Term Goals: Ability to identify and develop effective coping behaviors will improve and Compliance with prescribed medications will improve  Medication Management: RN will administer medications as ordered by provider, will assess and evaluate patient's response and provide education to patient for prescribed medication. RN will report any adverse and/or side effects to prescribing provider.  Therapeutic Interventions: 1 on 1 counseling sessions, Psychoeducation, Medication administration, Evaluate responses to treatment, Monitor vital signs and CBGs as ordered, Perform/monitor CIWA, COWS, AIMS and Fall Risk screenings as ordered, Perform wound care treatments as ordered.  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  Short Term Goals: Pt will be able to identify at least 5 coping skills for admitting dx by conclusion of recreation therapy tx.  Treatment Modalities: Group and Pet Therapy  Therapeutic Interventions: Psychoeducation  Evaluation of Outcomes: Progressing   LCSW Treatment Plan for Primary Diagnosis: MDD (major depressive disorder), recurrent severe, without psychosis ([place]) Long Term Goal(s): Safe transition to appropriate next level of care at discharge,  Engage patient in therapeutic group addressing interpersonal concerns.  Short Term Goals: Engage patient in aftercare planning with referrals and resources  Therapeutic Interventions: Assess for all discharge needs, 1 to 1 time with Social worker, Explore available resources and support systems, Assess for adequacy in community support network, Educate family and significant other(s) on suicide prevention, Complete Psychosocial Assessment, Interpersonal group therapy.  Evaluation of Outcomes: Met   Progress in Treatment: Attending groups: Yes Participating in groups: Yes Taking medication as prescribed: Yes Toleration medication: Yes, no side effects reported at this time Family/Significant other contact made: No Patient understands diagnosis: No  States he is fine and is ready to leave ASAP Discussing patient identified problems/goals with staff: Yes Medical problems stabilized or resolved: Yes Denies suicidal/homicidal ideation: Yes Issues/concerns per patient self-inventory: None Other: N/A  New problem(s) identified: None identified at this time.   New Short Term/Long Term Goal(s): None identified at this time.   Discharge Plan or Barriers: return to [facility] area, says grandmother will pick him up, declines follow up  Reason for Continuation of Hospitalization:  Estimated Length of Stay: D/C tomorrow  Attendees: Patient: 05/16/2016  8:40 AM  Physician: Ursula Alert, MD 05/16/2016  8:40 AM  Nursing: Hoy Register, RN 05/16/2016  8:40 AM  RN Care Manager: Lars Pinks, RN 05/16/2016  8:40 AM  Social Worker: Ripley Fraise 05/16/2016  8:40 AM  Recreational Therapist: Laretta Bolster  05/16/2016  8:40 AM  Other: Norberto Sorenson 05/16/2016  8:40 AM  Other:  05/16/2016  8:40 AM    Scribe for Treatment Team:  Roque Lias LCSW 05/16/2016 8:40 AM

## 2016-05-16 NOTE — Plan of Care (Signed)
Problem: Role Relationship: Goal: Ability to interact with others will improve Outcome: Progressing Patient is cooperative with staff. Patient seen interacting in the milieu. Patient participating in groups.

## 2016-05-16 NOTE — Progress Notes (Signed)
Recreation Therapy Notes  Date: 05/16/16 Time: 1000 Location: 500 Hall Dayroom  Group Topic: Coping Skills  Goal Area(s) Addresses:  Pt will be able to positive coping skills. Pt will be able to identify the importance of using positive coping skills post d/c.  Behavioral Response:  Engaged, Quiet  Intervention: Scientist, clinical (histocompatibility and immunogenetics)Construction paper, markers  Activity: Bridge the [employer].  Patients were to draw to connecting mountains on a Education officer, museumpiece of construction paper.  On the first hump, patients were to identify what brought them to the hospital.  On the second hump, patients were to identify what they hope to accomplish.  Next, patients were to draw a line connecting the two hump.  On that line, patients were to identify the coping skills that would help them reach their goal.   Education: Coping Skills, Discharge Planning.   Education Outcome: Acknowledges understanding/In group clarification offered/Needs additional education.   Clinical Observations/Feedback: Pt was quiet and spoke when engaged.  Pt did not want to share his thoughts during group.  Pt did express that he ultimately wanted to get back to his son.  Pt also stated that drugs was one of the reasons he was here.  Pt didn't share his coping skills but did state that if he uses his coping skills, he can "get back to where I used to be".   Caroll RancherMarjette Idalis Hoelting, LRT/CTRS         Caroll RancherLindsay, Varetta Chavers A 05/16/2016 12:52 PM

## 2016-05-16 NOTE — [facility] Group Notes (Signed)
[facility] LCSW Group Therapy  05/16/2016 1:15 pm  Type of Therapy: Process Group Therapy  Participation Level:  Active  Participation Quality:  Appropriate  Affect:  Flat  Cognitive:  Oriented  Insight:  Improving  Engagement in Group:  Limited  Engagement in Therapy:  Limited  Modes of Intervention:  Activity, Clarification, Education, Problem-solving and Support  Summary of Progress/Problems: Today's group addressed the issue of overcoming obstacles.  Patients were asked to identify their biggest obstacle post d/c that stands in the way of their on-going success, and then problem solve as to how to manage this. Stayed the entire time, engaged throughout.  Minimal contributions.  Stated that his biggest obstacle is "fixing the wrong problems.  But I'm figuring it out, and I'm on track again."  Stated his motivation is his 25 year old son Gunner.  Affect brightened significantly when talking about son.  Daryel Geraldorth, Marium Ragan B 05/16/2016   3:13 PM

## 2016-05-16 NOTE — Progress Notes (Signed)
[facility] MD Progress Note  05/16/2016 4:02 PM Hector Rivers  MRN:  [id number] Subjective:  Patient states " I am OK, I was abusing alcohol and MDMA."  Objective:Hector Rivers is a seen , chart reviewed, case discussed with treatment team. Pt seen as less anxious , reports he feels fine. Pt likely had substance induced psychosis , leading to his admission. Pt offered substance abuse treatment program. Continue to encourage and support.   Principal Problem: Substance-induced psychotic disorder with delusions (HCC) ( mdma, alcohol, stimulants , cannabis )  Diagnosis:   Patient Active Problem List   Diagnosis Date Noted  . 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 Alcohol use disorder, moderate, dependence (HCC) [F10.20] 05/16/2016  . Stimulant use disorder [F15.90] 05/16/2016  . Cannabis use disorder, moderate, dependence (HCC) [F12.20] 05/16/2016  . Hypokalemia [E87.6] 05/16/2016   Total Time spent with patient: 20 minutes  Past Psychiatric History: Please see H&P.   Past Medical History: Please see H&P.  Family History: Please see H&P.  Family Psychiatric  History: Denies Social History: Please see H&P.  History  Alcohol use Not on file     History  Drug use: Unknown    Social History   Social History  . Marital status: Single    Spouse name: N/A  . Number of children: N/A  . Years of education: N/A   Social History Main Topics  . Smoking status: None  . Smokeless tobacco: None  . Alcohol use None  . Drug use: Unknown  . Sexual activity: Not Asked   Other Topics Concern  . None   Social History Narrative  . None   Additional Social History:    History of alcohol / drug use?: Yes Longest period of sobriety (when/how long): Pateint denies any history of sobriety Negative  Consequences of Use: Financial, Armed forces operational officer, Personal relationships, Work / School Withdrawal Symptoms:  (None Reported) Name of Substance 1: Opiates 1 - Age of First Use: Patient reports that he does not remember  1 - Amount (size/oz): Varies 1 - Frequency: Varies 1 - Duration: Varies 1 - Last Use / Amount: 2 days ago.  Patient reports that he cannot remember what he used.                   Sleep: Fair  Appetite:  Fair  Current Medications: Current Facility-Administered Medications  Medication Dose Route Frequency Provider Last Rate Last Dose  . acetaminophen (TYLENOL) tablet 650 mg  650 mg Oral Q6H PRN Laveda Abbe, NP      . alum & mag hydroxide-simeth (MAALOX/MYLANTA) 200-200-20 MG/5ML suspension 30 mL  30 mL Oral Q4H PRN Laveda Abbe, NP      . haloperidol (HALDOL) tablet 5 mg  5 mg Oral Q6H PRN Laveda Abbe, NP       And  . benztropine (COGENTIN) tablet 1 mg  1 mg Oral Q6H PRN Laveda Abbe, NP      . cloNIDine (CATAPRES) tablet 0.1 mg  0.1 mg Oral BH-qamhs Laveda Abbe, NP       Followed by  . [START ON 05/18/2016] cloNIDine (CATAPRES) tablet 0.1 mg  0.1 mg Oral QAC breakfast Laveda Abbe, NP      . dicyclomine (BENTYL) tablet 20 mg  20 mg Oral Q6H PRN Laveda Abbe, NP      .  hydrOXYzine (ATARAX/VISTARIL) tablet 25 mg  25 mg Oral Q6H PRN Laveda AbbeLaurie Britton Parks, NP      . loperamide (IMODIUM) capsule 2-4 mg  2-4 mg Oral PRN Laveda AbbeLaurie Britton Parks, NP      . LORazepam (ATIVAN) tablet 2 mg  2 mg Oral Q6H PRN Laveda AbbeLaurie Britton Parks, NP       Or  . LORazepam (ATIVAN) injection 2 mg  2 mg Intramuscular Q6H PRN Laveda AbbeLaurie Britton Parks, NP      . magnesium hydroxide (MILK OF MAGNESIA) suspension 30 mL  30 mL Oral Daily PRN Laveda AbbeLaurie Britton Parks, NP      . methocarbamol (ROBAXIN) tablet 500 mg  500 mg Oral Q8H PRN Laveda AbbeLaurie Britton Parks, NP      . naproxen (NAPROSYN) tablet 500 mg  500 mg Oral BID PRN Laveda AbbeLaurie Britton Parks, NP      .  ondansetron (ZOFRAN-ODT) disintegrating tablet 4 mg  4 mg Oral Q6H PRN Laveda AbbeLaurie Britton Parks, NP      . traZODone (DESYREL) tablet 50 mg  50 mg Oral QHS PRN Laveda AbbeLaurie Britton Parks, NP        Lab Results: No results found for this or any previous visit (from the past 48 hour(s)).  Blood Alcohol level:  No results found for: [facility]  Metabolic Disorder Labs: No results found for: HGBA1C, MPG No results found for: PROLACTIN No results found for: CHOL, TRIG, HDL, CHOLHDL, VLDL, LDLCALC  Physical Findings: AIMS: Facial and Oral Movements Muscles of Facial Expression: None, normal Lips and Perioral Area: None, normal Jaw: None, normal Tongue: None, normal,Extremity Movements Upper (arms, wrists, hands, fingers): None, normal Lower (legs, knees, ankles, toes): None, normal, Trunk Movements Neck, shoulders, hips: None, normal, Overall Severity Severity of abnormal movements (highest score from questions above): None, normal Incapacitation due to abnormal movements: None, normal Patient's awareness of abnormal movements (rate only patient's report): No Awareness, Dental Status Current problems with teeth and/or dentures?: No Does patient usually wear dentures?: No  CIWA:  CIWA-Ar Total: 2 COWS:  COWS Total Score: 0  Musculoskeletal: Strength & Muscle Tone: within normal limits Gait & Station: normal Patient leans: N/A  Psychiatric Specialty Exam: Physical Exam  Nursing note and vitals reviewed. Cardiovascular: Normal rate.     Review of Systems  Psychiatric/Behavioral: Positive for substance abuse. The patient is nervous/anxious.   All other systems reviewed and are negative.   Blood pressure 117/72, pulse (!) 116, temperature 98.3 F (36.8 C), temperature source Oral, resp. rate 16.There is no height or weight on file to calculate BMI.  General Appearance: Casual  Eye Contact:  Fair  Speech:  Clear and Coherent  Volume:  Normal  Mood:  Anxious  Affect:  Congruent  Thought  Process:  Coherent and Descriptions of Associations: Circumstantial  Orientation:  Full (Time, Place, and Person)  Thought Content:  Logical  Suicidal Thoughts:  No  Homicidal Thoughts:  No  Memory:  Immediate;   Fair Recent;   Fair Remote;   Fair  Judgement:  Fair  Insight:  Lacking  Psychomotor Activity:  Restlessness  Concentration:  Concentration: Fair and Attention Span: Fair  Recall:  FiservFair  Fund of Knowledge:  Fair  Language:  Fair  Akathisia:  No  Handed:  Right  AIMS (if indicated):     Assets:  Communication Skills Physical Health  ADL's:  Intact  Cognition:  WNL  Sleep:  Number of Hours: 6.25     Treatment Plan Summary:  Substance-induced psychotic disorder  with delusions [facility](HCC) MDMA, alcohol   Will continue today 05/16/16  plan as below except where it is noted.  Daily contact with patient to assess and evaluate symptoms and progress in treatment and Medication management  Continue with Trazodone 50 mg for insomnia Continue clonidine Protocol. Vistaril 25 mg po q6h prn for anxiety sx. Patient with UDS positive for cannabis, stimulants, MDMA- Pt however tries to minimize abuse . Pt with K+ low - will repeat CMP today if he agrees. Pt refuses HIV/STD /Hepatitis testing - his AST/ALT is very high - he reports he will get testing done on an out patient basis. Will continue to monitor vitals ,medication compliance and treatment side effects while patient is here.  CSW will continue working on disposition. Patient to be referred to substance abuse program if he is motivated. Provided substance abuse counseling. Patient to participate in therapeutic milieu  Ellan Tess, MD 05/16/2016, 4:02 PM

## 2016-05-16 NOTE — Progress Notes (Signed)
Adult Psychoeducational Group Note  Date:  05/16/2016 Time:  9:11 PM  Group Topic/Focus:  Wrap-Up Group:   The focus of this group is to help patients review their daily goal of treatment and discuss progress on daily workbooks.   Participation Level:  Active  Participation Quality:  Appropriate  Affect:  Appropriate  Cognitive:  Alert  Insight: Appropriate  Engagement in Group:  Engaged  Modes of Intervention:  Discussion  Additional Comments:  Patient states, "my day was alright". Patient's goal for today was to socialize more.  Tavian Callander L Annaston Upham 05/16/2016, 9:11 PM

## 2016-05-16 NOTE — [facility] Suicide Risk Assessment (Signed)
[facility] INPATIENT:  Family/Significant Other Suicide Prevention Education  Suicide Prevention Education:  Education Completed; No one has been identified by the patient as the family member/significant other with whom the patient will be residing, and identified as the person(s) who will aid the patient in the event of a mental health crisis (suicidal ideations/suicide attempt).  With written consent from the patient, the family member/significant other has been provided the following suicide prevention education, prior to the and/or following the discharge of the patient.  The suicide prevention education provided includes the following:  Suicide risk factors  Suicide prevention and interventions  National Suicide Hotline telephone number  [facility] assessment telephone number  [facility] Assistance 911  [facility] and/or Residential Mobile Crisis Unit telephone number  Request made of family/significant other to:  Remove weapons (e.g., guns, rifles, knives), all items previously/currently identified as safety concern.    Remove drugs/medications (over-the-counter, prescriptions, illicit drugs), all items previously/currently identified as a safety concern.  The family member/significant other verbalizes understanding of the suicide prevention education information provided.  The family member/significant other agrees to remove the items of safety concern listed above. The patient did not endorse SI at the time of admission, nor did the patient c/o SI during the stay here.  SPE not required.  Hector Rivers 05/16/2016, 3:17 PM

## 2016-05-16 NOTE — Progress Notes (Signed)
D: Pt A & O X3. Denies SI, HI, AVH and pain when assessed. Presents animated with frequent shifting in bed on initial approach this AM. Refused his scheduled medications (Clonidine) when offered, "I don't need it, I'm ok right now". A: Emotional support and availability provided to pt. Encouraged pt to attend unit groups, comply with medication regimen and voice concerns to staff. Q 15 minutes safety checks maintained on and off unit without outburst or self harm gestures.  R: Pt attended groups as scheduled. Tolerates all PO intake well. Denies concerns at this time. POC maintained for mood stability and safety.

## 2016-05-16 NOTE — Progress Notes (Signed)
Recreation Therapy Notes  INPATIENT RECREATION THERAPY ASSESSMENT  Patient Details Name: Hector Rivers MRN: [id number] DOB: May 26, 1991 Today's Date: 05/16/2016  Patient Stressors: Other (Comment) (Knowing I shouldn't be here)  Pt stated he was here for hallucinations.  Coping Skills:   Isolate, Arguments, Substance Abuse, Avoidance, Exercise, Art/Dance, Talking, Music, Sports  Personal Challenges: Anger, Relationships, Self-Esteem/Confidence  Leisure Interests (2+):  Social - Family (Spend time with son)  Awareness of Community Resources:  No  Patient Strengths:  Chief Executive OfficerHard worker; good learner  Patient Identified Areas of Improvement:  Be a better person in general  Current Recreation Participation:  1-2 times a week  Patient Goal for Hospitalization:  "Reach my goal"  [place]  [place] Residence:  [place]   Current SI (including self-harm):  No  Current HI:  No  Consent to Intern Participation: N/A   Caroll RancherMarjette Kodee Drury, LRT/CTRS  Caroll RancherLindsay, Viera Okonski A 05/16/2016, 2:56 PM

## 2016-05-16 NOTE — Progress Notes (Signed)
Nursing Progress Note 7p-7a  D) Patient presents with flat affect. Patient forwards little and is minimal with Clinical research associatewriter but seen interacting and brightening in the dayroom with other patients. Patient denies SI/HI/AVH or pain. Patient is calm but will not take his PM medication stating "I don't need that". Patient contracts for safety at this time.   A) Patient educated about medication benefits. When prompted, patient does not have a reason for not taking medications, he just says "that's not needed for me". Patient on q15 min safety checks. Opportunities for questions or concerns presented to patient.   R) Patient remains safe on the unit at this time. Patient is resting in bed without complaints. Will continue to monitor.

## 2016-05-16 NOTE — Plan of Care (Signed)
Problem: Health Behavior/Discharge Planning: Goal: Compliance with therapeutic regimen will improve Outcome: Not Progressing Pt refused scheduled medication when offered this AM; despite multiple verbal encouragement.   Problem: Safety: Goal: Ability to remain free from injury will improve Outcome: Progressing Q 15 minutes safety checks maintained on and off unit without outburst or self harm gestures to note at this time.

## 2016-05-17 MED ORDER — TRAZODONE HCL 50 MG PO TABS: 50.0000 mg | ORAL_TABLET | Freq: Every evening | ORAL | 0 refills | Status: AC | PRN

## 2016-05-17 MED ORDER — HYDROXYZINE HCL 25 MG PO TABS: 25.0000 mg | ORAL_TABLET | Freq: Four times a day (QID) | ORAL | 0 refills | Status: AC | PRN

## 2016-05-17 NOTE — Plan of Care (Signed)
Problem: [facility] Participation in Recreation Therapeutic Interventions Goal: STG-Patient will identify at least five coping skills for ** STG: Coping Skills - Patient will be able to identify at least 5 coping skills for substance abuse by conclusion of recreation therapy tx  Outcome: Completed/Met Date Met: 05/17/16 Pt was able to identify coping skills at completion of coping skills recreation therapy session.  Victorino Sparrow, LRT/CTRS

## 2016-05-17 NOTE — Progress Notes (Signed)
Discharge note:  Patient discharged homer per MD order.  Patient received all personal belongings from unit and locker.  Patient will follow up with his outpatient provider.  Reviewed AVS/discharge instructions and patient indicated understanding.  Patient denies any thoughts of self harm.  Patient left ambulatory with his girlfriend.

## 2016-05-17 NOTE — Progress Notes (Signed)
Recreation Therapy Notes  Date: 05/17/16 Time: 1000 Location: 500 Hall Dayroom  Group Topic: Self-Esteem  Goal Area(s) Addresses:  Patient will identify positive ways to increase self-esteem. Patient will verbalize benefit of increased self-esteem.  Behavioral Response: Engaged  Intervention: Blank mask worksheet, colored pencils  Activity: How I See Me.  Patients were given a sheet with a blank mask on it.  Patients were asked to write on the back of the sheet how they feel other people view them.  On the blank mask, patients were asked to design how they view themselves.  Patients could use pictures and words to express their thoughts.  Education:  Self-Esteem, Building control surveyorDischarge Planning.   Education Outcome: Acknowledges education/In group clarification offered/Needs additional education  Clinical Observations/Feedback: Pt expressed that others see him as "an asshole, nice, and gentleman".  Pt went on to say that he sees himself as polite and good looking.  Pt stated if he focuses on the positive he can "remove the negative" about himself.   Caroll RancherMarjette Zeke Aker, LRT/CTRS         Caroll RancherLindsay, Ari Bernabei A 05/17/2016 11:41 AM

## 2016-05-17 NOTE — Tx Team (Signed)
Initial Treatment Plan 05/17/2016 2:37 AM Runell Gessillon C Bures [id number]    PATIENT STRESSORS: Health problems Substance abuse   PATIENT STRENGTHS: Active sense of humor Capable of independent living   PATIENT IDENTIFIED PROBLEMS: "i don't need help"  "i don't know"  Paranoid Delusions  Anxiety  Psychosis             DISCHARGE CRITERIA:  Ability to meet basic life and health needs Adequate post-discharge living arrangements Improved stabilization in mood, thinking, and/or behavior Medical problems require only outpatient monitoring Motivation to continue treatment in a less acute level of care Need for constant or close observation no longer present Reduction of life-threatening or endangering symptoms to within safe limits Safe-care adequate arrangements made Verbal commitment to aftercare and medication compliance  PRELIMINARY DISCHARGE PLAN: Outpatient therapy  PATIENT/FAMILY INVOLVEMENT: This treatment plan has been presented to and reviewed with the patient, Runell GessDillon C Krolikowski.  The patient and family have been given the opportunity to ask questions and make suggestions.  Ferrel LoganAmanda A Brailynn Breth, RN 05/17/2016, 2:37 AM

## 2016-05-17 NOTE — [facility] Suicide Risk Assessment (Signed)
[facility]   Principal Problem: Substance-induced psychotic disorder with delusions [facility](HCC) Discharge Diagnoses:  Patient Active Problem List   Diagnosis Date Noted  . 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 Alcohol use disorder, moderate, dependence (HCC) [F10.20] 05/16/2016  . Stimulant use disorder [F15.90] 05/16/2016  . Cannabis use disorder, moderate, dependence (HCC) [F12.20] 05/16/2016  . Hypokalemia [E87.6] 05/16/2016    Total Time spent with patient: 30 minutes  Musculoskeletal: Strength & Muscle Tone: within normal limits Gait & Station: normal Patient leans: N/A  Psychiatric Specialty Exam: Review of Systems  Psychiatric/Behavioral: Positive for substance abuse. Negative for hallucinations and suicidal ideas.  All other systems reviewed and are negative.   Blood pressure 108/63, pulse 68, temperature 98.3 F (36.8 C), temperature source Oral, resp. rate 16.There is no height or weight on file to calculate BMI.  General Appearance: Casual  Eye Contact::  Fair  Speech:  Clear and Coherent409  Volume:  Normal  Mood:  Euthymic  Affect:  Appropriate  Thought Process:  Goal Directed and Descriptions of Associations: Intact  Orientation:  Full (Time, Place, and Person)  Thought Content:  Logical  Suicidal Thoughts:  No  Homicidal Thoughts:  No  Memory:  Immediate;   Fair Recent;   Fair Remote;   Fair  Judgement:  Fair  Insight:  Lacking  Psychomotor Activity:  Normal  Concentration:  Fair  Recall:  FiservFair  Fund of Knowledge:Fair  Language: Fair  Akathisia:  No  Handed:  Right  AIMS (if indicated):     Assets:  Desire for Improvement  Sleep:  Number of Hours: 6  Cognition: WNL  ADL's:  Intact   Mental Status Per Nursing Assessment::   On  Admission:     Demographic Factors:  Male and Caucasian  Loss Factors: NA  Historical Factors: Impulsivity  Risk Reduction Factors:   Positive social support  Continued Clinical Symptoms:  Alcohol/Substance Abuse/Dependencies  Cognitive Features That Contribute To Risk:  Polarized thinking    Suicide Risk:  Minimal: No identifiable suicidal ideation.  Patients presenting with no risk factors but with morbid ruminations; may be classified as minimal risk based on the severity of the depressive symptoms  Follow-up Information    No one Follow up.   Why:  Pt declined follow up          Plan Of Care/Follow-up recommendations:  Activity:  no restrictions Diet:  regular Other:  none  Zooey Schreurs, MD 05/17/2016, 9:28 AM
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
## 2016-08-04 DEATH — deceased

## 2017-03-09 ENCOUNTER — Encounter (HOSPITAL_COMMUNITY): Payer: Self-pay | Admitting: Emergency Medicine

## 2017-03-09 ENCOUNTER — Emergency Department (HOSPITAL_COMMUNITY)
Admission: EM | Admit: 2017-03-09 | Discharge: 2017-03-10 | Disposition: A | Discharge status: Home / Self Care | Payer: Self-pay | Attending: Emergency Medicine | Admitting: Emergency Medicine

## 2017-03-09 DIAGNOSIS — F17200 Nicotine dependence, unspecified, uncomplicated: Secondary | ICD-10-CM | POA: Insufficient documentation

## 2017-03-09 DIAGNOSIS — F1120 Opioid dependence, uncomplicated: Secondary | ICD-10-CM

## 2017-03-09 LAB — COMPREHENSIVE METABOLIC PANEL
ALBUMIN: 4 g/dL (ref 3.5–5.0)
ALT: 35 U/L (ref 17–63)
AST: 24 U/L (ref 15–41)
Alkaline Phosphatase: 52 U/L (ref 38–126)
Anion gap: 8 (ref 5–15)
BUN: 13 mg/dL (ref 6–20)
CHLORIDE: 105 mmol/L (ref 101–111)
CO2: 26 mmol/L (ref 22–32)
CREATININE: 0.96 mg/dL (ref 0.61–1.24)
Calcium: 9.4 mg/dL (ref 8.9–10.3)
GFR calc Af Amer: >60 mL/min (ref >60)
GFR calc non Af Amer: >60 mL/min (ref >60)
GLUCOSE: 87 mg/dL (ref 65–99)
POTASSIUM: 4.5 mmol/L (ref 3.5–5.1)
SODIUM: 139 mmol/L (ref 135–145)
Total Bilirubin: 0.4 mg/dL (ref 0.3–1.2)
Total Protein: 7.2 g/dL (ref 6.5–8.1)

## 2017-03-09 LAB — CBC WITH DIFFERENTIAL/PLATELET
BASOS ABS: 0 10*3/uL (ref 0.0–0.1)
BASOS PCT: 0 %
EOS ABS: 0.2 10*3/uL (ref 0.0–0.7)
EOS PCT: 2 %
HCT: 42.7 % (ref 39.0–52.0)
Hemoglobin: 13.7 g/dL (ref 13.0–17.0)
LYMPHS PCT: 30 %
Lymphs Abs: 3.2 10*3/uL (ref 0.7–4.0)
MCH: 29.3 pg (ref 26.0–34.0)
MCHC: 32.1 g/dL (ref 30.0–36.0)
MCV: 91.4 fL (ref 78.0–100.0)
Monocytes Absolute: 0.7 10*3/uL (ref 0.1–1.0)
Monocytes Relative: 6 %
Neutro Abs: 6.6 10*3/uL (ref 1.7–7.7)
Neutrophils Relative %: 62 %
PLATELETS: 384 10*3/uL (ref 150–400)
RBC: 4.67 MIL/uL (ref 4.22–5.81)
RDW: 13 % (ref 11.5–15.5)
WBC: 10.7 10*3/uL — AB (ref 4.0–10.5)

## 2017-03-09 LAB — RAPID URINE DRUG SCREEN, HOSP PERFORMED
AMPHETAMINES: POSITIVE — AB
BENZODIAZEPINES: NOT DETECTED
Barbiturates: NOT DETECTED
Cocaine: NOT DETECTED
Opiates: NOT DETECTED
TETRAHYDROCANNABINOL: NOT DETECTED

## 2017-03-09 LAB — ETHANOL: Alcohol, Ethyl (B): <10 mg/dL (ref <10)

## 2017-03-09 MED ORDER — LORAZEPAM 1 MG PO TABS
1.0000 mg | ORAL_TABLET | Freq: Once | ORAL | Status: AC
Start: 2017-03-09 — End: 2017-03-09
  Administered 2017-03-09: 1 mg via ORAL
  Filled 2017-03-09: qty 1

## 2017-03-09 NOTE — ED Provider Notes (Signed)
[facility]-EMERGENCY DEPT Provider Note   CSN: [id number] Arrival date & time: 03/09/17  1929     History   Chief Complaint Chief Complaint  Patient presents with  . Heroin Addiction    HPI Hector Rivers is a 26 y.o. male.  HPI Patient presents to the emergency department requesting detox for heroin and methamphetamines.  Patient states that he has a facility that we will taken once he has detoxed, but cannot go there until that time.  Patient states that he is having some nausea but no vomiting at this time and feels very anxious.  The patient did not take any medications prior to arrival. The patient denies chest pain, shortness of breath, headache,blurred vision, neck pain, fever, cough, weakness, numbness, dizziness, anorexia, edema, abdominal pain, nausea, vomiting, diarrhea, rash, back pain, dysuria, hematemesis, bloody stool, near syncope, or syncope. History reviewed. No pertinent past medical history.  Patient Active Problem List   Diagnosis Date Noted  . 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 Substance-induced psychotic disorder with delusions (HCC) 05/16/2016  . MDMA abuse (HCC) 05/16/2016  . Alcohol use disorder, moderate, dependence (HCC) 05/16/2016  . Stimulant use disorder 05/16/2016  . Cannabis use disorder, moderate, dependence (HCC) 05/16/2016  . Hypokalemia 05/16/2016    History reviewed. No pertinent surgical history.     Home Medications    Prior to Admission medications   Medication Sig Start Date End Date Taking? Authorizing Provider  hydrOXYzine (ATARAX/VISTARIL) 25 MG tablet Take 1 tablet (25 mg total) by mouth every 6 (six) hours as needed for anxiety. 05/17/16   Adonis Brook, NP  traZODone (DESYREL) 50 MG tablet Take 1 tablet (50 mg total) by mouth at bedtime as needed for sleep. 05/17/16   Adonis Brook, NP    Family History No family history on file.  Social History Social  History  Substance Use Topics  . Smoking status: Current Every Day Smoker  . Smokeless tobacco: Never Used  . Alcohol use Yes     Allergies   Patient has no known allergies.   Review of Systems Review of Systems All other systems negative except as documented in the HPI. All pertinent positives and negatives as reviewed in the HPI.  Physical Exam Updated Vital Signs BP 113/71 (BP Location: Left Arm)   Pulse 71   Temp 97.8 F (36.6 C) (Oral)   Resp 18   SpO2 100%   Physical Exam  Constitutional: He is oriented to person, place, and time. He appears well-developed and well-nourished. No distress.  HENT:  Head: Normocephalic and atraumatic.  Mouth/Throat: Oropharynx is clear and moist.  Eyes: Pupils are equal, round, and reactive to light.  Neck: Normal range of motion. Neck supple.  Cardiovascular: Normal rate, regular rhythm and normal heart sounds.  Exam reveals no gallop and no friction rub.   No murmur heard. Pulmonary/Chest: Effort normal and breath sounds normal. No respiratory distress. He has no wheezes.  Neurological: He is alert and oriented to person, place, and time. He exhibits normal muscle tone. Coordination normal.  Skin: Skin is warm and dry. Capillary refill takes less than 2 seconds. No rash noted. No erythema.  Psychiatric: He has a normal mood and affect. His behavior is normal. He expresses no homicidal and no suicidal ideation. He expresses no suicidal plans and no homicidal plans.  Nursing note and vitals reviewed.    ED Treatments / Results  Labs (all labs ordered are listed, but only  abnormal results are displayed) Labs Reviewed  CBC WITH DIFFERENTIAL/PLATELET - Abnormal; Notable for the following:       Result Value   WBC 10.7 (*)    All other components within normal limits  RAPID URINE DRUG SCREEN, HOSP PERFORMED - Abnormal; Notable for the following:    Amphetamines POSITIVE (*)    All other components within normal limits    COMPREHENSIVE METABOLIC PANEL  ETHANOL    EKG  EKG Interpretation None       Radiology No results found.  Procedures Procedures (including critical care time)  Medications Ordered in ED Medications  LORazepam (ATIVAN) tablet 1 mg (not administered)     Initial Impression / Assessment and Plan / ED Course  I have reviewed the triage vital signs and the nursing notes.  Pertinent labs & imaging results that were available during my care of the patient were reviewed by me and considered in my medical decision making (see chart for details).    The patient will be referred to [facility] we will fax the information to them.  Patient can asked them to pick him up here in the emergency department.  Patient is not suicidal or homicidal  Final Clinical Impressions(s) / ED Diagnoses   Final diagnoses:  None    New Prescriptions New Prescriptions   No medications on file     Charlestine Night, Cordelia Poche 03/09/17 2339    Tegeler, Canary Brim, MD 03/10/17 1012

## 2017-03-09 NOTE — ED Triage Notes (Signed)
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

## 2017-03-10 NOTE — ED Notes (Signed)
Pt called [facility] who states pt tested pos for meth therefore cannot be accepted today; [facility] staff also stated admissions counselor will not be available until morning of Friday 03/10/17 and encouraged pt to stay in ER until then; pt agreeable with this plan, Charge RN Lequita Halt also agrees to allow pt to stay in hallway; Ebbie Ridge also agreeable to this plan

## 2017-03-10 NOTE — ED Notes (Signed)
[facility] is full, the patient is refusing to go on their waiting list, they are referring for him to go to [facility]. edp notified.

## 2017-03-10 NOTE — ED Notes (Addendum)
RN called [facility] once more to speak with staff regarding this patient; [facility] staff member stated that they do not detox meth users (depends on the admission counselors discretion) nor do they provide transportation; pt asleep at this time, will inform when he wakens

## 2017-03-10 NOTE — ED Provider Notes (Signed)
The patient has been waiting here overnight to communicate with social services, and possibly go to [facility].  [facility] has been contacted and currently they have a waiting list, which may offer to put the patient on however he has declined.  Patient is currently stable without signs and symptoms of problems with substance abuse and/or withdrawal.  There is currently no indication to keep him here any longer, and he is stable for discharge.   Mancel Bale, MD 03/10/17 [phone]

## 2017-03-10 NOTE — ED Notes (Signed)
Pt dc'd home with a bus pass, references on outpatient services including [facility] address and phone number. E-signature not available. Pt denies any concerns with dc.

## 2017-03-10 NOTE — ED Notes (Signed)
[facility] called, counselor is on another line and will call this RN back

## 2017-03-10 NOTE — Discharge Instructions (Signed)
We have checked with [facility], and you are welcome to go there as needed for treatment.  Currently, they have a waiting list.  We are supplying additional information on resources for substance abuse and treatment.  It is important to avoid all types of illegal drugs, to improve your chances of quitting them.  Make sure that you are getting plenty of rest, eating and drinking regularly, and getting some exercise.  See the doctor of your choice as needed for problems.
